# Patient Record
Sex: Female | Born: 1985 | Race: White | Hispanic: No | Marital: Married | Smoking: Never smoker
Health system: Southern US, Community
[De-identification: ages and names within clinical notes are randomized; demographics above are authoritative.]

---

## 2017-11-16 ENCOUNTER — Emergency Department (HOSPITAL_COMMUNITY)
Admission: EM | Admit: 2017-11-16 | Discharge: 2017-11-16 | Disposition: A | Discharge status: Home / Self Care | Payer: Self-pay | Attending: Emergency Medicine | Admitting: Emergency Medicine

## 2017-11-16 ENCOUNTER — Emergency Department (HOSPITAL_COMMUNITY): Payer: Self-pay

## 2017-11-16 ENCOUNTER — Other Ambulatory Visit: Payer: Self-pay

## 2017-11-16 ENCOUNTER — Encounter (HOSPITAL_COMMUNITY): Payer: Self-pay | Admitting: *Deleted

## 2017-11-16 DIAGNOSIS — J111 Influenza due to unidentified influenza virus with other respiratory manifestations: Secondary | ICD-10-CM

## 2017-11-16 DIAGNOSIS — R69 Illness, unspecified: Secondary | ICD-10-CM | POA: Insufficient documentation

## 2017-11-16 LAB — GROUP A STREP BY PCR: Group A Strep by PCR: NOT DETECTED

## 2017-11-16 MED ORDER — HYDROCODONE-HOMATROPINE 5-1.5 MG/5ML PO SYRP: 5.0000 mL | ORAL_SOLUTION | Freq: Four times a day (QID) | ORAL | 0 refills | Status: AC | PRN

## 2017-11-16 NOTE — ED Provider Notes (Signed)
[facility]-EMERGENCY DEPT Provider Note   CSN: [id number] Arrival date & time: 11/16/17  0900     History   Chief Complaint Chief Complaint  Patient presents with  . Fever  . Sore Throat    HPI Lauren Hogan is a 32 y.o. female.  Patient is a 32 year old female who presents with flulike symptoms.  She states that she was recently in [place] with 300 other people that were traveling.  She states multiple other people had similar flulike symptoms.  Her symptoms started 3 days ago.  She has had runny nose congestion and coughing.  She is also had a sore throat.  No shortness of breath.  No vomiting or diarrhea.  She feels very achy and has had subjective fevers.  She is been using some over-the-counter medicines with no significant improvement in symptoms.     History reviewed. No pertinent past medical history.  There are no active problems to display for this patient.   History reviewed. No pertinent surgical history.   OB History   None      Home Medications    Prior to Admission medications   Medication Sig Start Date End Date Taking? Authorizing Provider  HYDROcodone-homatropine (HYCODAN) 5-1.5 MG/5ML syrup Take 5 mLs by mouth every 6 (six) hours as needed for cough. 11/16/17   Rolan Bucco, MD    Family History No family history on file.  Social History Social History   Tobacco Use  . Smoking status: Never Smoker  . Smokeless tobacco: Never Used  Substance Use Topics  . Alcohol use: Yes  . Drug use: Never     Allergies   Penicillins   Review of Systems Review of Systems  Constitutional: Positive for activity change, appetite change, chills, fatigue and fever. Negative for diaphoresis.  HENT: Positive for congestion, postnasal drip, rhinorrhea and sore throat. Negative for sneezing.   Eyes: Negative.   Respiratory: Positive for cough. Negative for chest tightness and shortness of breath.   Cardiovascular:  Negative for chest pain and leg swelling.  Gastrointestinal: Negative for abdominal pain, blood in stool, diarrhea, nausea and vomiting.  Genitourinary: Negative for difficulty urinating, flank pain, frequency and hematuria.  Musculoskeletal: Positive for myalgias. Negative for arthralgias and back pain.  Skin: Negative for rash.  Neurological: Negative for dizziness, speech difficulty, weakness, numbness and headaches.     Physical Exam Updated Vital Signs BP 113/73 (BP Location: Left Arm)   Pulse 81   Temp 98.4 F (36.9 C) (Oral)   Resp 16   Ht 5\' 2"  (1.575 m)   Wt 59 kg   SpO2 98%   BMI 23.78 kg/m   Physical Exam  Constitutional: She is oriented to person, place, and time. She appears well-developed and well-nourished.  HENT:  Head: Normocephalic and atraumatic.  Right Ear: Tympanic membrane normal.  Left Ear: Tympanic membrane normal.  Mouth/Throat: Uvula is midline and oropharynx is clear and moist. No oropharyngeal exudate, posterior oropharyngeal edema or posterior oropharyngeal erythema.  Eyes: Pupils are equal, round, and reactive to light.  Neck: Normal range of motion. Neck supple.  Cardiovascular: Normal rate, regular rhythm and normal heart sounds.  Pulmonary/Chest: Effort normal and breath sounds normal. No respiratory distress. She has no wheezes. She has no rales. She exhibits no tenderness.  Abdominal: Soft. Bowel sounds are normal. There is no tenderness. There is no rebound and no guarding.  Musculoskeletal: Normal range of motion. She exhibits no edema.  Lymphadenopathy:  She has no cervical adenopathy.  Neurological: She is alert and oriented to person, place, and time.  Skin: Skin is warm and dry. No rash noted.  Psychiatric: She has a normal mood and affect.     ED Treatments / Results  Labs (all labs ordered are listed, but only abnormal results are displayed) Labs Reviewed  GROUP A STREP BY PCR    EKG None  Radiology Dg Chest 2  View  Result Date: 11/16/2017 CLINICAL DATA:  32 year old female with increasing cough sore throat and fever for 2 days. [place] travel to [place]. EXAM: CHEST - 2 VIEW COMPARISON:  None. FINDINGS: Lung volumes and mediastinal contours are normal. Visualized tracheal air column is within normal limits. Both lungs appear clear. No pleural effusion. No osseous abnormality identified. Negative visible bowel gas pattern. IMPRESSION: Negative.  No cardiopulmonary abnormality. Electronically Signed   By: Odessa FlemingH  Hall M.D.   On: 11/16/2017 09:52    Procedures Procedures (including critical care time)  Medications Ordered in ED Medications - No data to display   Initial Impression / Assessment and Plan / ED Course  I have reviewed the triage vital signs and the nursing notes.  Pertinent labs & imaging results that were available during my care of the patient were reviewed by me and considered in my medical decision making (see chart for details).     Patient presents with flulike symptoms.  Her vital signs are within normal limits.  Her chest x-ray is clear without suggestions of pneumonia.  Her strep test is negative.  She is otherwise well-appearing.  She is out of the window for Tamiflu.  She was discharged home in good condition.  She was given a prescription for Hycodan cough syrup and advised in other symptomatic care.  Return precautions were given.  Final Clinical Impressions(s) / ED Diagnoses   Final diagnoses:  Influenza-like illness    ED Discharge Orders         Ordered    HYDROcodone-homatropine (HYCODAN) 5-1.5 MG/5ML syrup  Every 6 hours PRN     11/16/17 1034           Rolan BuccoBelfi, Shequita Peplinski, MD 11/16/17 1040

## 2017-11-16 NOTE — ED Triage Notes (Addendum)
Pt states she was in [place], on return multiple people on the plan were sick, Sunday developed fever and sore throat, denies N/V/D. Fever treated with Motrin PTA Now added chest pain from coughing? Non productive cough.

## 2020-02-21 IMAGING — CR DG CHEST 2V
2 series · 2 of 2 positions shown · non-contrast
Comparison: None.

CLINICAL DATA: 32-year-old female with increasing cough sore throat
and fever for 2 days. Recent travel to [place].

EXAM:
CHEST - 2 VIEW

[w chest pa]
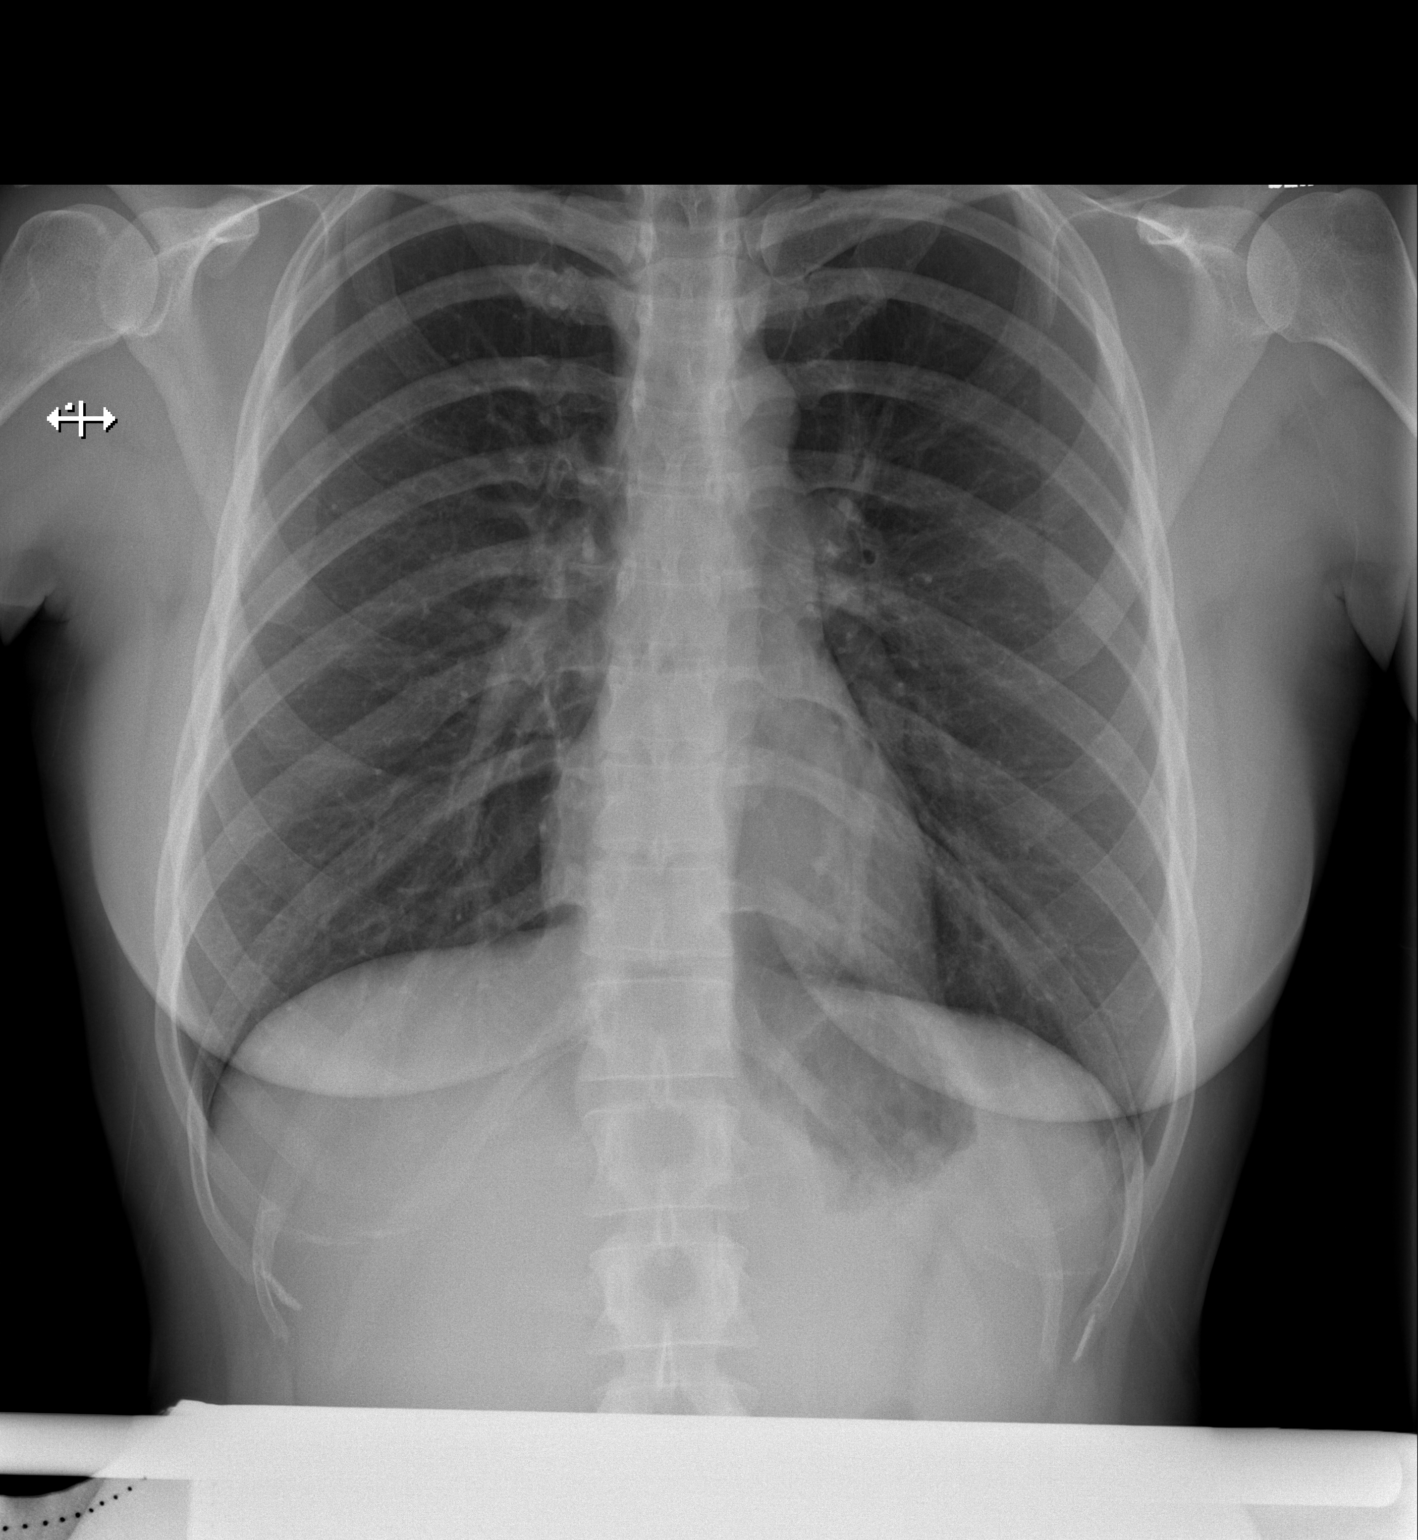

[w chest lat]
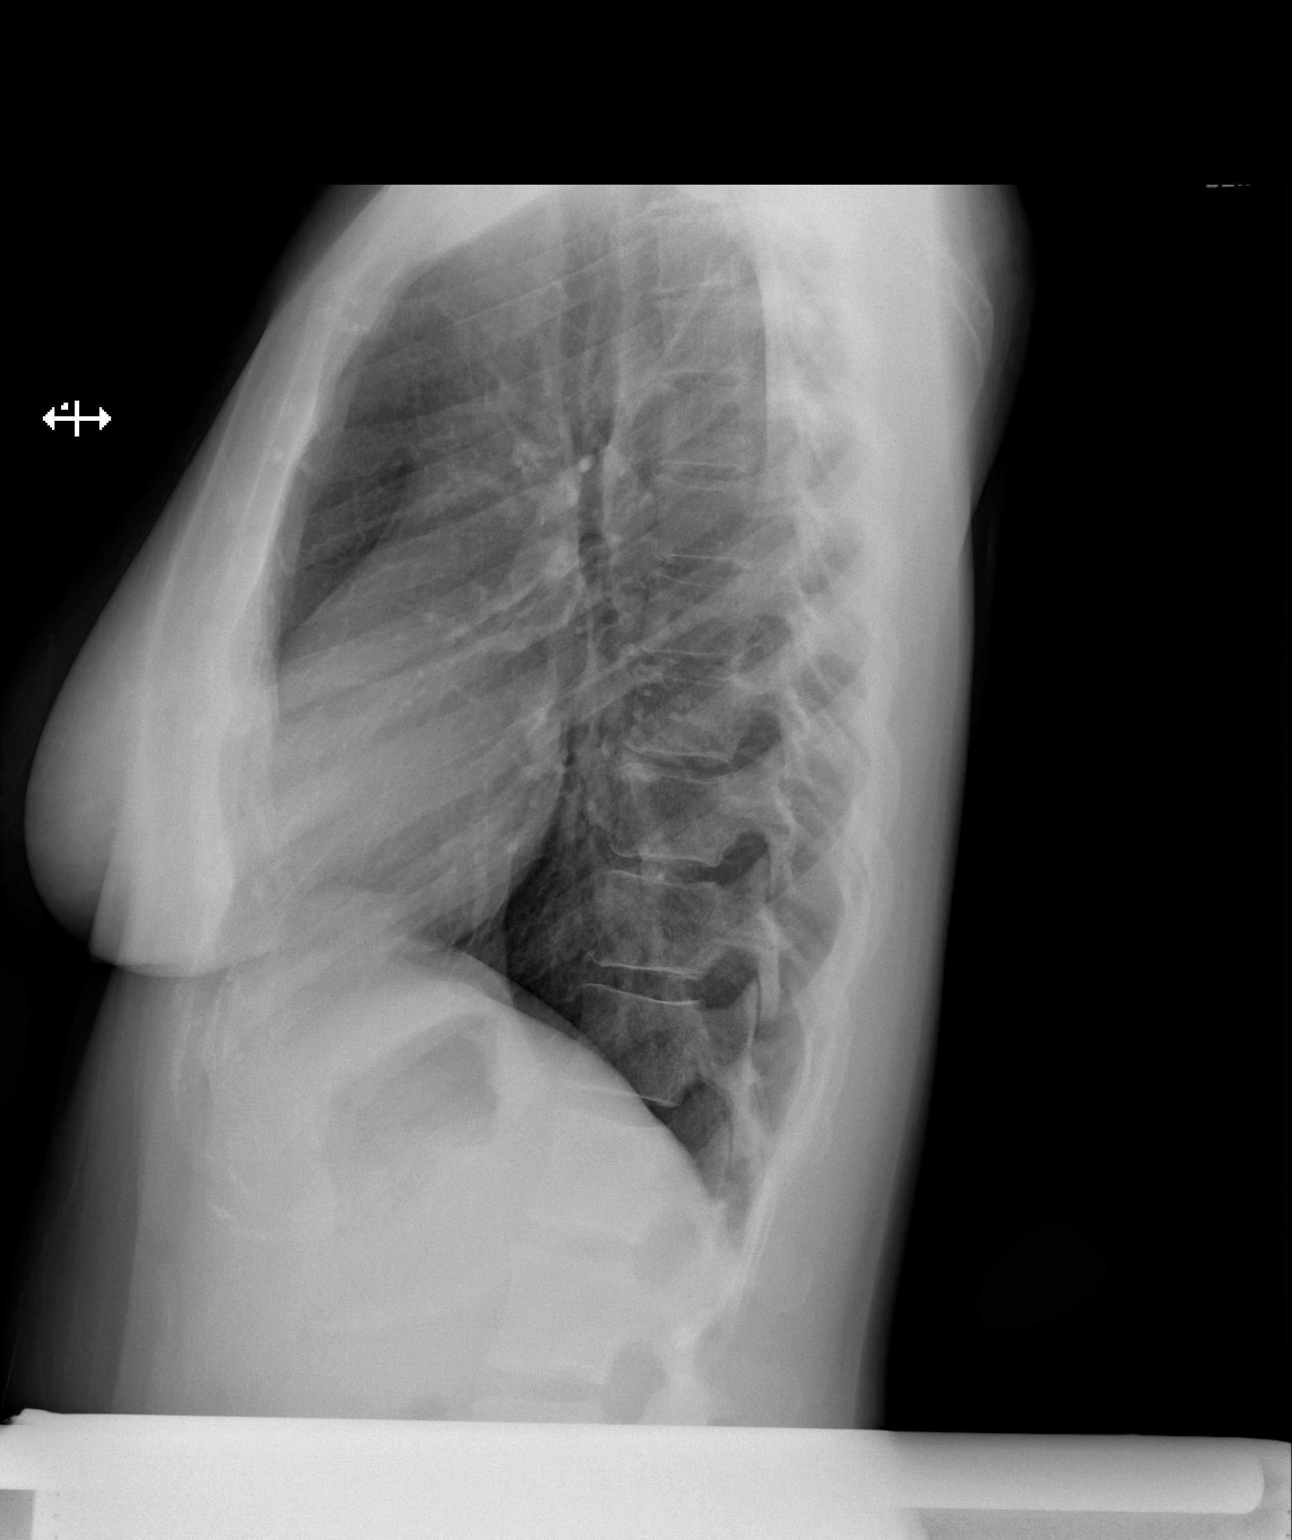

[2 of 2 positions shown; findings below may reference images not displayed]

FINDINGS: Lung volumes and mediastinal contours are normal. Visualized
tracheal air column is within normal limits. Both lungs appear
clear. No pleural effusion. No osseous abnormality identified.
Negative visible bowel gas pattern.
IMPRESSION: Negative.  No cardiopulmonary abnormality.
